# Patient Record
Sex: Male | Born: 1985 | Race: White | Hispanic: No | Marital: Married | Smoking: Never smoker
Health system: Southern US, Community
[De-identification: ages and names within clinical notes are randomized; demographics above are authoritative.]

---

## 2016-12-01 ENCOUNTER — Encounter (HOSPITAL_COMMUNITY): Payer: Self-pay | Admitting: Emergency Medicine

## 2016-12-01 ENCOUNTER — Emergency Department (HOSPITAL_COMMUNITY): Payer: Self-pay

## 2016-12-01 ENCOUNTER — Emergency Department (HOSPITAL_COMMUNITY)
Admission: EM | Admit: 2016-12-01 | Discharge: 2016-12-01 | Disposition: A | Discharge status: Home / Self Care | Payer: Self-pay | Attending: Emergency Medicine | Admitting: Emergency Medicine

## 2016-12-01 DIAGNOSIS — T189XXA Foreign body of alimentary tract, part unspecified, initial encounter: Secondary | ICD-10-CM | POA: Insufficient documentation

## 2016-12-01 DIAGNOSIS — Y999 Unspecified external cause status: Secondary | ICD-10-CM | POA: Insufficient documentation

## 2016-12-01 DIAGNOSIS — Y929 Unspecified place or not applicable: Secondary | ICD-10-CM | POA: Insufficient documentation

## 2016-12-01 DIAGNOSIS — Y9389 Activity, other specified: Secondary | ICD-10-CM | POA: Insufficient documentation

## 2016-12-01 DIAGNOSIS — X58XXXA Exposure to other specified factors, initial encounter: Secondary | ICD-10-CM | POA: Insufficient documentation

## 2016-12-01 NOTE — ED Notes (Signed)
Pt reports 2 days ago he was eating a chicken sandwich and he believes he swallowed about a 1/2 inch of the toothpick. Pt states he did not notice the toothpick while he was chewing but that when he proceeded to swallow he could "feel it scratch his throat" and that it now feels "tight, like I have a pill stuck in my throat." Pt denies difficulty swallowing, eating/drinking. Pt reports he has experienced mild coughing but denies any hemoptysis or swelling of his throat. Denies drooling. Pt speaking in full complete sentences with clear voice.

## 2016-12-01 NOTE — ED Notes (Signed)
ED Provider at bedside. 

## 2016-12-01 NOTE — Discharge Instructions (Signed)
Please follow-up with Dr. Matthias Hughs tomorrow at 12 PM at his office.  Please know food or drinks after 830, your okay to have a very light breakfast prior to that.  If you proceed with an endoscopy he will need family or close friend to be present with you for this.  If you develop any new or worsening signs or symptoms overnight please return immediately to [facility] emergency room for repeat analysis and further evaluation.

## 2016-12-01 NOTE — ED Provider Notes (Signed)
[facility] Provider Note   CSN: [id number] Arrival date & time: 12/01/16  1707     History   Chief Complaint Chief Complaint  Patient presents with  . Swallowed Foreign Body    HPI Jordan Fuller is a 31 y.o. male.  HPI   31 year old male presents today with foreign body sensation.  Patient notes that 2 days ago he was eating chicken that had a toothpick in it.  He notes the toothpick was cut at approximately 2-3 cm.  He noticed severe pain with swallowing and felt as if the toothpick got lodged in his esophagus.  Patient notes since that time he has had discomfort in the area, this is not worsened.  He reports that he is tolerating secretions, able to eat and drink without significant difficulty.  Patient reports the pain is worse at night when laying back.  He denies any fever, chest pain, or any infectious terms.  Patient denies any bleeding or dark stools.    History reviewed. No pertinent past medical history.  There are no active problems to display for this patient.   History reviewed. No pertinent surgical history.     Home Medications    Prior to Admission medications   Medication Sig Start Date End Date Taking? Authorizing Provider  acetaminophen (TYLENOL) 325 MG tablet Take 650 mg by mouth every 6 (six) hours as needed for mild pain or headache.   Yes Historical Provider, MD  carboxymethylcellulose (REFRESH PLUS) 0.5 % SOLN Place 1 drop into both eyes 3 (three) times daily as needed (dryness).   Yes Historical Provider, MD    Family History No family history on file.  Social History Social History  Substance Use Topics  . Smoking status: Never Smoker  . Smokeless tobacco: Never Used  . Alcohol use No     Allergies   Penicillins   Review of Systems Review of Systems  All other systems reviewed and are negative.    Physical Exam Updated Vital Signs BP 119/86 (BP Location: Right Arm)   Pulse 84   Temp 98.9 F (37.2 C)   Resp 18    Ht 5\' 3"  (1.6 m)   Wt 106.6 kg   SpO2 97%   BMI 41.63 kg/m   Physical Exam  Constitutional: He is oriented to person, place, and time. He appears well-developed and well-nourished.  HENT:  Head: Normocephalic and atraumatic.  Eyes: Conjunctivae are normal. Pupils are equal, round, and reactive to light. Right eye exhibits no discharge. Left eye exhibits no discharge. No scleral icterus.  Neck: Normal range of motion. Neck supple. No JVD present. No tracheal deviation present.  Pulmonary/Chest: Effort normal. No stridor.  Neurological: He is alert and oriented to person, place, and time. Coordination normal.  Psychiatric: He has a normal mood and affect. His behavior is normal. Judgment and thought content normal.  Nursing note and vitals reviewed.    ED Treatments / Results  Labs (all labs ordered are listed, but only abnormal results are displayed) Labs Reviewed - No data to display  EKG  EKG Interpretation None       Radiology Dg Neck Soft Tissue  Result Date: 12/01/2016 CLINICAL DATA:  Pt said he believes he has swallowed a piece of a toothpick. Throat feels like something is stuck. EXAM: NECK SOFT TISSUES - 1+ VIEW COMPARISON:  None. FINDINGS: There is no evidence of retropharyngeal soft tissue swelling or epiglottic enlargement. The cervical airway is unremarkable and no radio-opaque foreign body identified.  IMPRESSION: 1. No radiodense foreign body identified. Of note, wood has variable radiodensity. 2. Normal epiglottis and airway. Electronically Signed   By: Genevive Bi M.D.   On: 12/01/2016 18:49    Procedures Procedures (including critical care time)  Medications Ordered in ED Medications - No data to display   Initial Impression / Assessment and Plan / ED Course  I have reviewed the triage vital signs and the nursing notes.  Pertinent labs & imaging results that were available during my care of the patient were reviewed by me and considered in my  medical decision making (see chart for details).    Labs:   Imaging: DG soft tissue neck  Consults:  Therapeutics:  Discharge Meds:   Assessment/Plan: 31 year old male presents today with foreign body sensation.  She swallowed a toothpick.  He continues to have the sensation.  He is very well-appearing is nontoxic with no chest pain or respiratory difficulty.  He has no difficulty tolerating secretions and has not been able to eat and drink.  Plain films show no significant findings.  I spoke with Dr. Matthias Hughs who would like to see the patient tomorrow in his office at 76.  Patient was given strict follow-up information, encouraged not to eat or drink after 830, follow-up as directed.  Patient is given strict return precautions in the event new or worsening signs or symptoms present.  He verbalized understanding and agreement to today's plan had no further questions or concerns.  I have very low suspicion for esophageal rupture in this patient, low suspicion for infectious etiology.      Final Clinical Impressions(s) / ED Diagnoses   Final diagnoses:  Swallowed foreign body, initial encounter    New Prescriptions Discharge Medication List as of 12/01/2016  8:45 PM       Eyvonne Mechanic, PA-C 12/01/16 2057    Tilden Fossa, MD 12/02/16 [phone]

## 2016-12-01 NOTE — ED Triage Notes (Signed)
Pt reports possibly a toothpick is stuck in his throat ongoing for 1 1/2 days. Pt reports every time he swallows feels like something is scraping his throat. Pt able to talk in complete sentences without difficulty, no drooling noted.

## 2018-01-14 IMAGING — DX DG NECK SOFT TISSUE
2 series · 2 of 2 positions shown · non-contrast
Comparison: None.

CLINICAL DATA: Pt said he believes he has swallowed a piece of a
toothpick. Throat feels like something is stuck.

EXAM:
NECK SOFT TISSUES - 1+ VIEW

[neck lat]
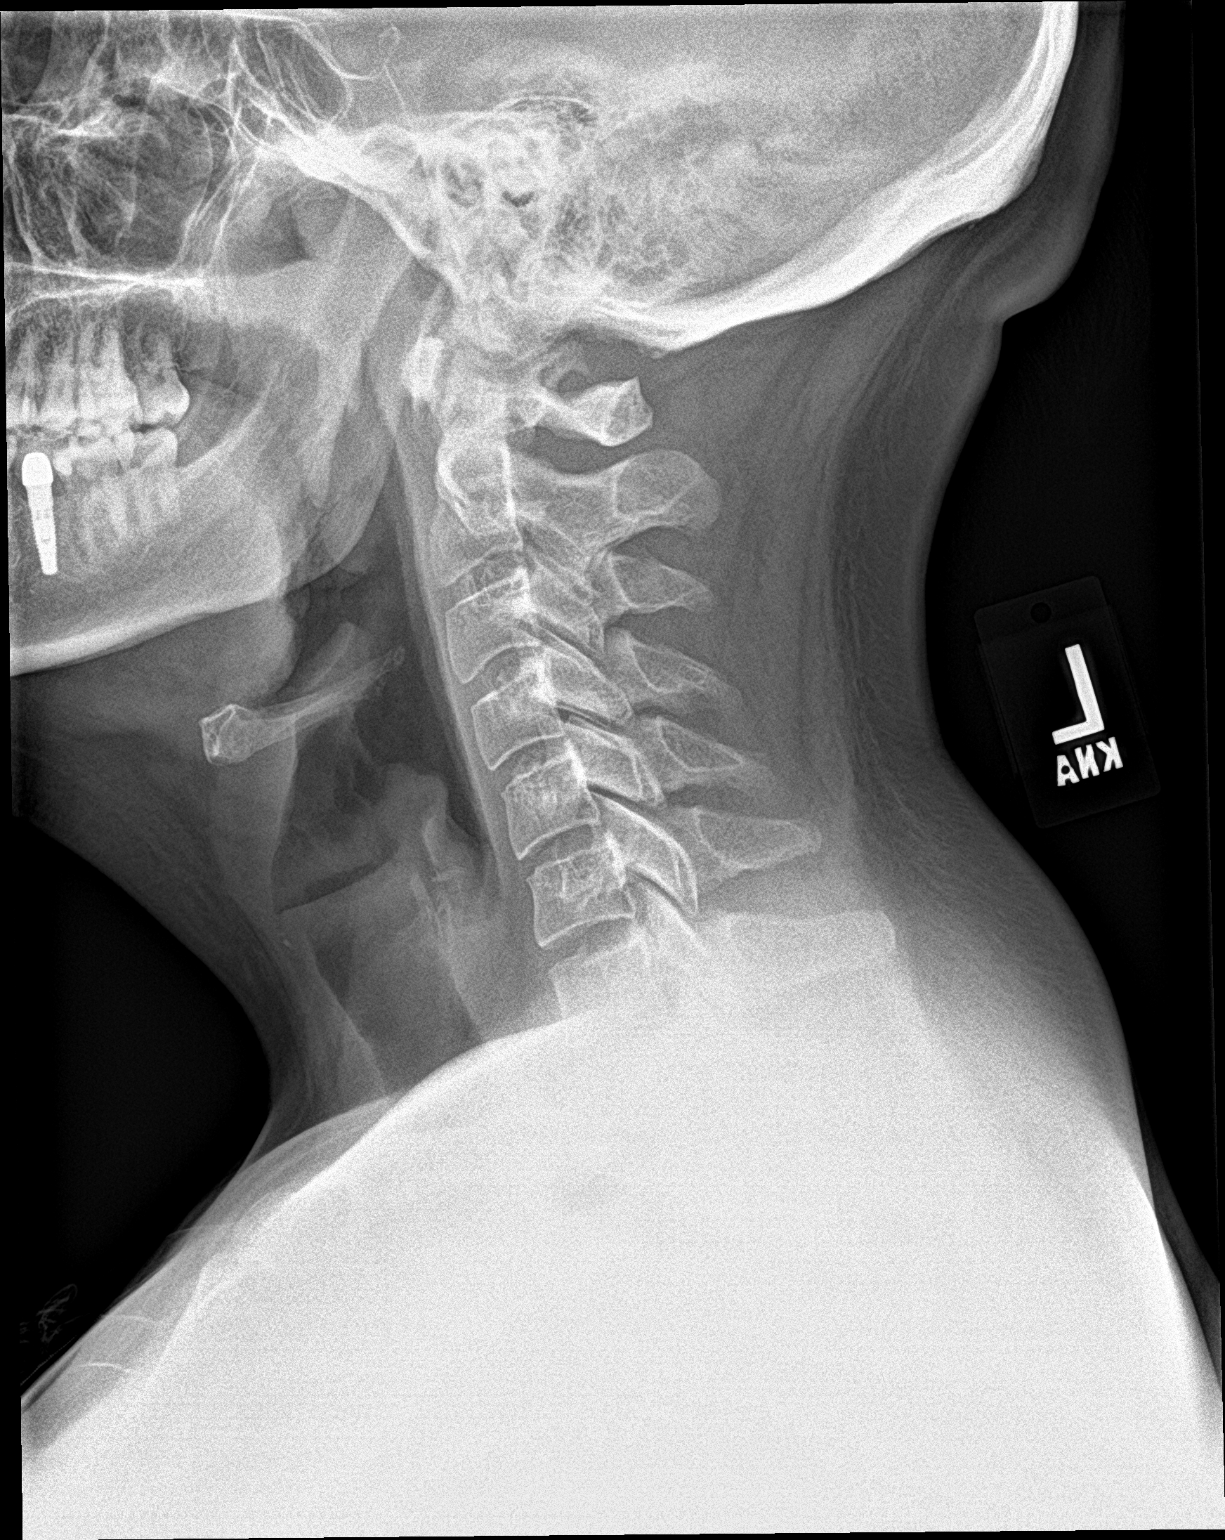

[neck ap]
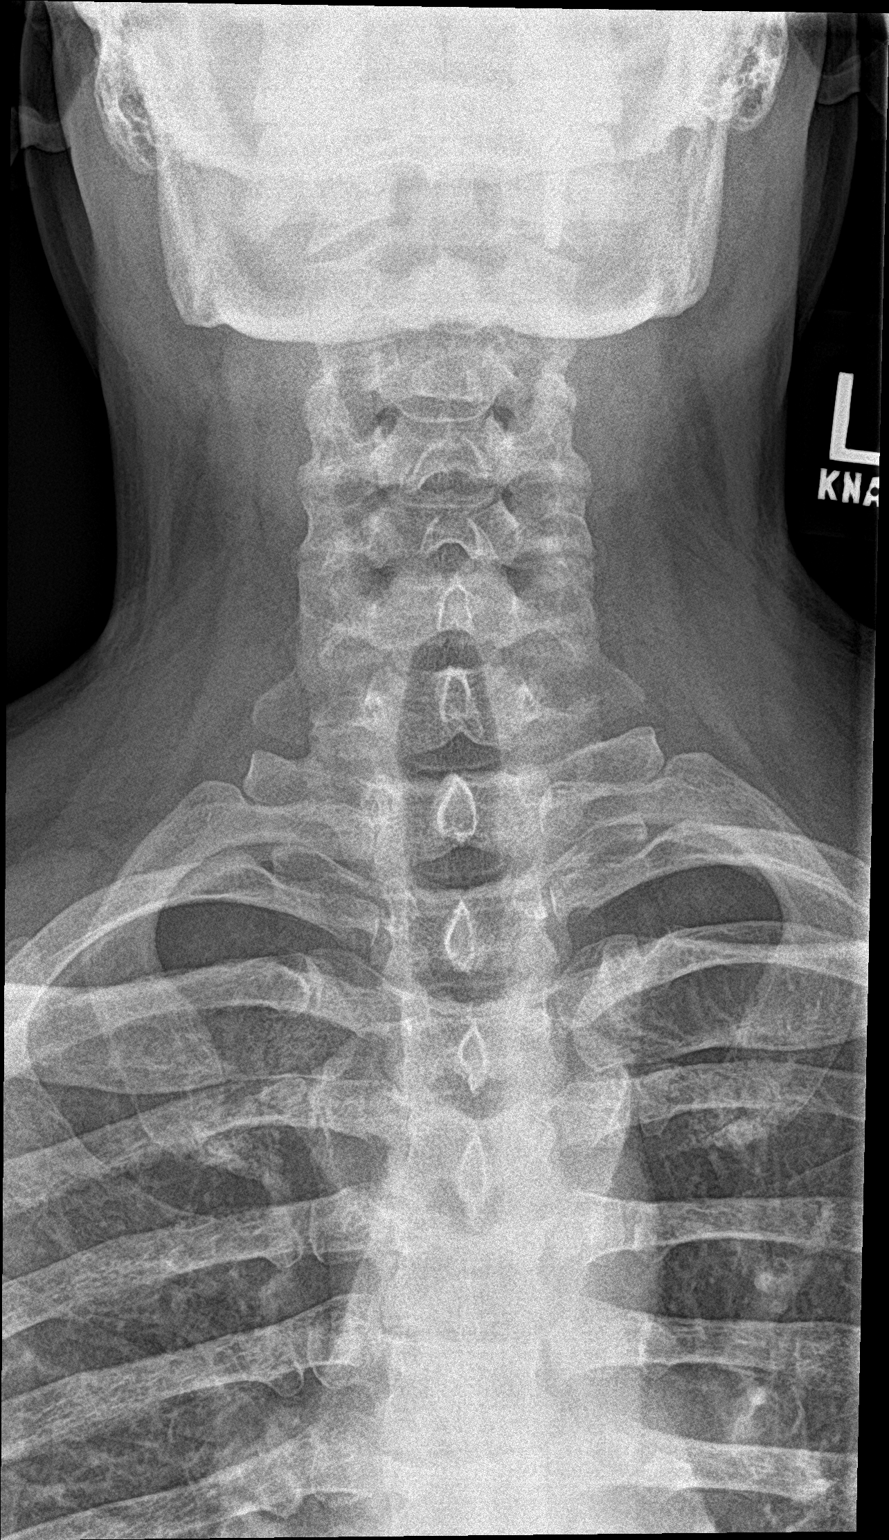

[2 of 2 positions shown; findings below may reference images not displayed]

FINDINGS: There is no evidence of retropharyngeal soft tissue swelling or
epiglottic enlargement. The cervical airway is unremarkable and no
radio-opaque foreign body identified.
IMPRESSION: 1. No radiodense foreign body identified. Of note, Alyse has variable
radiodensity.
2. Normal epiglottis and airway.

## 2021-05-10 ENCOUNTER — Ambulatory Visit
Admission: EM | Admit: 2021-05-10 | Discharge: 2021-05-10 | Disposition: A | Discharge status: Home / Self Care | Payer: Self-pay | Attending: Emergency Medicine | Admitting: Emergency Medicine

## 2021-05-10 ENCOUNTER — Encounter: Payer: Self-pay | Admitting: Emergency Medicine

## 2021-05-10 ENCOUNTER — Other Ambulatory Visit: Payer: Self-pay

## 2021-05-10 DIAGNOSIS — G44209 Tension-type headache, unspecified, not intractable: Secondary | ICD-10-CM

## 2021-05-10 MED ORDER — NAPROXEN 500 MG PO TABS: 500.0000 mg | ORAL_TABLET | Freq: Two times a day (BID) | ORAL | 0 refills | Status: AC

## 2021-05-10 NOTE — Discharge Instructions (Addendum)
PleasePlease use Naprosyn as needed for headaches  Follow-up if headaches not improving or worsening despite use of new eyewear

## 2021-05-10 NOTE — ED Triage Notes (Signed)
Patient presents to [facility] for evaluation of headaches he is having at work, starting within the two hours, and states its because of the lights at work.  States he needs a note to explain that he needs shaded eyewear.

## 2021-05-11 NOTE — ED Provider Notes (Signed)
[facility]    CSN: [id number] Arrival date & time: 05/10/21  1331      History   Chief Complaint Chief Complaint  Patient presents with   Headache    HPI Jordan Fuller is a 35 y.o. male presenting today for evaluation of headache and eye irritation.  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  Since he has been developing daily headaches with going to work, denies headaches on days he does not work.  Denies history of prior headaches.  Not relieved with Tylenol and ibuprofen.  Denies any nausea or vomiting.  HPI  History reviewed. No pertinent past medical history.  There are no problems to display for this patient.   History reviewed. No pertinent surgical history.     Home Medications    Prior to Admission medications   Medication Sig Start Date End Date Taking? Authorizing Provider  naproxen (NAPROSYN) 500 MG tablet Take 1 tablet (500 mg total) by mouth 2 (two) times daily. 05/10/21  Yes Marlis Oldaker C, PA-C  acetaminophen (TYLENOL) 325 MG tablet Take 650 mg by mouth every 6 (six) hours as needed for mild pain or headache.    [provider]  carboxymethylcellulose (REFRESH PLUS) 0.5 % SOLN Place 1 drop into both eyes 3 (three) times daily as needed (dryness).    [provider]    Family History Family History  Problem Relation Age of Onset   Healthy Mother    Healthy Father     Social History Social History   Tobacco Use   Smoking status: Never   Smokeless tobacco: Never  Substance Use Topics   Alcohol use: No   Drug use: Yes    Types: Marijuana     Allergies   Penicillins   Review of Systems Review of Systems  Constitutional:  Negative for fatigue and fever.  HENT:  Negative for congestion, sinus pressure and sore throat.   Eyes:  Negative for photophobia, pain and visual disturbance.  Respiratory:  Negative for cough and shortness of breath.    Cardiovascular:  Negative for chest pain.  Gastrointestinal:  Negative for abdominal pain, nausea and vomiting.  Genitourinary:  Negative for decreased urine volume and hematuria.  Musculoskeletal:  Negative for myalgias, neck pain and neck stiffness.  Neurological:  Positive for headaches. Negative for dizziness, syncope, facial asymmetry, speech difficulty, weakness, light-headedness and numbness.    Physical Exam Triage Vital Signs ED Triage Vitals  Enc Vitals Group     BP 05/10/21 1347 122/80     Pulse Rate 05/10/21 1347 77     Resp 05/10/21 1347 18     Temp 05/10/21 1347 98.1 F (36.7 C)     Temp Source 05/10/21 1347 Oral     SpO2 05/10/21 1347 96 %     Weight --      Height --      Head Circumference --      Peak Flow --      Pain Score 05/10/21 1349 5     Pain Loc --      Pain Edu? --      Excl. in GC? --    No data found.  Updated Vital Signs BP 122/80 (BP Location: Right Arm)   Pulse 77   Temp 98.1 F (36.7 C) (Oral)   Resp 18   SpO2 96%   Visual Acuity Right Eye Distance:   Left  Eye Distance:   Bilateral Distance:    Right Eye Near:   Left Eye Near:    Bilateral Near:     Physical Exam Vitals and nursing note reviewed.  Constitutional:      Appearance: He is well-developed.     Comments: No acute distress  HENT:     Head: Normocephalic and atraumatic.     Nose: Nose normal.  Eyes:     Extraocular Movements: Extraocular movements intact.     Conjunctiva/sclera: Conjunctivae normal.     Pupils: Pupils are equal, round, and reactive to light.  Cardiovascular:     Rate and Rhythm: Normal rate.  Pulmonary:     Effort: Pulmonary effort is normal. No respiratory distress.  Abdominal:     General: There is no distension.  Musculoskeletal:        General: Normal range of motion.     Cervical back: Neck supple.  Skin:    General: Skin is warm and dry.  Neurological:     General: No focal deficit present.     Mental Status: He is alert and  oriented to person, place, and time. Mental status is at baseline.     Cranial Nerves: No cranial nerve deficit.     UC Treatments / Results  Labs (all labs ordered are listed, but only abnormal results are displayed) Labs Reviewed - No data to display  EKG   Radiology No results found.  Procedures Procedures (including critical care time)  Medications Ordered in UC Medications - No data to display  Initial Impression / Assessment and Plan / UC Course  I have reviewed the triage vital signs and the nursing notes.  Pertinent labs & imaging results that were available during my care of the patient were reviewed by me and considered in my medical decision making (see chart for details).     New pattern of headaches since starting new job environment, do recommend patient wears initiated eyewear at his work can provide.  Feel new symptoms are likely correlated to environment/lighting in job.  Advised to follow-up if not improving despite use of eye protection.  Discussed strict return precautions. Patient verbalized understanding and is agreeable with plan.  Final Clinical Impressions(s) / UC Diagnoses   Final diagnoses:  Acute non intractable tension-type headache     Discharge Instructions      PleasePlease use Naprosyn as needed for headaches  Follow-up if headaches not improving or worsening despite use of new eyewear   ED Prescriptions     Medication Sig Dispense Auth. Provider   naproxen (NAPROSYN) 500 MG tablet Take 1 tablet (500 mg total) by mouth 2 (two) times daily. 30 tablet Stefon Ramthun, [place] C, PA-C      PDMP not reviewed this encounter.   Sharyon Cable [place] C, [place] 05/11/21 [phone]

## 2021-09-21 ENCOUNTER — Other Ambulatory Visit: Payer: Self-pay

## 2021-09-21 ENCOUNTER — Emergency Department
Admission: RE | Admit: 2021-09-21 | Discharge: 2021-09-21 | Disposition: A | Discharge status: Home / Self Care | Payer: 59 | Source: Ambulatory Visit | Attending: Family Medicine | Admitting: Family Medicine

## 2021-09-21 VITALS — BP 111/78 | HR 96 | Temp 99.0°F | Resp 16 | Ht 66.0 in | Wt 250.0 lb

## 2021-09-21 DIAGNOSIS — J069 Acute upper respiratory infection, unspecified: Secondary | ICD-10-CM

## 2021-09-21 DIAGNOSIS — J9801 Acute bronchospasm: Secondary | ICD-10-CM | POA: Diagnosis not present

## 2021-09-21 LAB — POCT INFLUENZA A/B
Influenza A, POC: NEGATIVE
Influenza B, POC: NEGATIVE

## 2021-09-21 MED ORDER — PREDNISONE 20 MG PO TABS: ORAL_TABLET | ORAL | 0 refills | Status: AC

## 2021-09-21 MED ORDER — METHYLPREDNISOLONE SODIUM SUCC 125 MG IJ SOLR
80.0000 mg | Freq: Once | INTRAMUSCULAR | Status: AC
  Administered 2021-09-21: 80 mg via INTRAMUSCULAR

## 2021-09-21 MED ORDER — ALBUTEROL SULFATE HFA 108 (90 BASE) MCG/ACT IN AERS
2.0000 | INHALATION_SPRAY | Freq: Four times a day (QID) | RESPIRATORY_TRACT | Status: DC | PRN
  Administered 2021-09-21: 2 via RESPIRATORY_TRACT

## 2021-09-21 MED ORDER — DOXYCYCLINE HYCLATE 100 MG PO CAPS: ORAL_CAPSULE | ORAL | 0 refills | Status: AC

## 2021-09-21 NOTE — ED Provider Notes (Signed)
[facility]    CSN: [id number] Arrival date & time: 09/21/21  1405      History   Chief Complaint Chief Complaint  Patient presents with   Cough    HPI Jordan Fuller is a 36 y.o. male.   About 6 days ago patient began sneezing frequently with an increase in clear nasal drainage.  This was followed by cough, fatigue, myalgias, and tightness in his chest.  He denies pleuritic pain and fevers, chills, and sweats.  He has a history of reactive airways disease that only manifests during viral URI's, having had multiple episodes of pneumonia in the past.  Patient's wife and daughter have tested negative for COVID.  The history is provided by the patient.   History reviewed. No pertinent past medical history.  There are no problems to display for this patient.   History reviewed. No pertinent surgical history.     Home Medications    Prior to Admission medications   Medication Sig Start Date End Date Taking? Authorizing Provider  doxycycline (VIBRAMYCIN) 100 MG capsule Take one cap PO Q12hr with food. 09/21/21  Yes Lattie Haw, MD  predniSONE (DELTASONE) 20 MG tablet Take one tab by mouth twice daily for 4 days, then one daily for 3 days. Take with food. 09/21/21  Yes Lattie Haw, MD  acetaminophen (TYLENOL) 325 MG tablet Take 650 mg by mouth every 6 (six) hours as needed for mild pain or headache. Patient not taking: Reported on 09/21/2021    [provider]  carboxymethylcellulose (REFRESH PLUS) 0.5 % SOLN Place 1 drop into both eyes 3 (three) times daily as needed (dryness). Patient not taking: Reported on 09/21/2021    [provider]  naproxen (NAPROSYN) 500 MG tablet Take 1 tablet (500 mg total) by mouth 2 (two) times daily. Patient not taking: Reported on 09/21/2021 05/10/21   Lew Dawes, PA-C    Family History Family History  Problem Relation Age of Onset   Healthy Mother    Healthy Father     Social History Social History    Tobacco Use   Smoking status: Never    Passive exposure: Never   Smokeless tobacco: Never  Vaping Use   Vaping Use: Never used  Substance Use Topics   Alcohol use: No   Drug use: Yes    Types: Marijuana     Allergies   Penicillins   Review of Systems Review of Systems No sore throat + cough + sneezing No pleuritic pain, but feels tight in anterior chest ? wheezing + nasal congestion + post-nasal drainage No sinus pain/pressure No itchy/red eyes No earache No hemoptysis No SOB No fever/chills No nausea No vomiting No abdominal pain No diarrhea No urinary symptoms No skin rash + fatigue + myalgias No headache Used OTC meds (Dayquil and Nyquil) without relief   Physical Exam Triage Vital Signs ED Triage Vitals  Enc Vitals Group     BP 09/21/21 1531 111/78     Pulse Rate 09/21/21 1531 96     Resp 09/21/21 1531 16     Temp 09/21/21 1531 99 F (37.2 C)     Temp Source 09/21/21 1531 Oral     SpO2 09/21/21 1531 99 %     Weight 09/21/21 1533 250 lb (113.4 kg)     Height 09/21/21 1533 5\' 6"  (1.676 m)     Head Circumference --      Peak Flow --      Pain  Score 09/21/21 1532 4     Pain Loc --      Pain Edu? --      Excl. in GC? --    No data found.  Updated Vital Signs BP 111/78 (BP Location: Left Arm)    Pulse 96    Temp 99 F (37.2 C) (Oral)    Resp 16    Ht 5\' 6"  (1.676 m)    Wt 113.4 kg    SpO2 99%    BMI 40.35 kg/m   Visual Acuity Right Eye Distance:   Left Eye Distance:   Bilateral Distance:    Right Eye Near:   Left Eye Near:    Bilateral Near:     Physical Exam Nursing notes and Vital Signs reviewed. Appearance:  Patient appears stated age, and in no acute distress Eyes:  Pupils are equal, round, and reactive to light and accomodation.  Extraocular movement is intact.  Conjunctivae are not inflamed  Ears:  Canals normal.  Tympanic membranes normal.  Nose:  Mildly congested turbinates.  No sinus tenderness.   Pharynx:  Normal Neck:   Supple.  Mildly enlarged lateral nodes are present, tender to palpation on the left.   Lungs:   Diffuse bilateral wheezes and rhonchi. Breath sounds are equal.  Moving air poorly. Heart:  Regular rate and rhythm without murmurs, rubs, or gallops.  Abdomen:  Nontender without masses or hepatosplenomegaly.  Bowel sounds are present.  No CVA or flank tenderness.  Extremities:  No edema.  Skin:  No rash present.   UC Treatments / Results  Labs (all labs ordered are listed, but only abnormal results are displayed) Labs Reviewed  POCT INFLUENZA A/B negative    EKG   Radiology No results found.  Procedures Procedures (including critical care time)  Medications Ordered in UC Medications  methylPREDNISolone sodium succinate (SOLU-MEDROL) 125 mg/2 mL injection 80 mg (80 mg Intramuscular Given 09/21/21 1640)    Initial Impression / Assessment and Plan / UC Course  I have reviewed the triage vital signs and the nursing notes.  Pertinent labs & imaging results that were available during my care of the patient were reviewed by me and considered in my medical decision making (see chart for details).    History or reactive airways disease exacerbated by viral URI's. Administered Depo Medrol 80mg  IM, then begin prednisone burst/taper. Because of past history of multiple episodes of pneumonia during viral illnesses, will begin doxycycline. Followup with Family Doctor if not improved in one week.   Final Clinical Impressions(s) / UC Diagnoses   Final diagnoses:  Viral URI with cough  Acute bronchospasm     Discharge Instructions      Begin prednisone Saturday 09/22/2021. Take plain guaifenesin (1200mg  extended release tabs such as Mucinex) twice daily, with plenty of water, for cough and congestion.  May add Pseudoephedrine (30mg , one or two every 4 to 6 hours) for sinus congestion.  Get adequate rest.   May use Afrin nasal spray (or generic oxymetazoline) each morning for about 5 days  and then discontinue.  Also recommend using saline nasal spray several times daily and saline nasal irrigation (AYR is a common brand).  Use Flonase nasal spray each morning after using Afrin nasal spray and saline nasal irrigation. Try warm salt water gargles for sore throat.  Stop all antihistamines (Nyquil, etc) for now, and other non-prescription cough/cold preparations. Continue albuterol inhaler as needed. May take Delsym Cough Suppressant ("12 Hour Cough Relief") at bedtime for nighttime  cough.   If symptoms become significantly worse during the night or over the weekend, proceed to the local emergency room.     ED Prescriptions     Medication Sig Dispense Auth. Provider   predniSONE (DELTASONE) 20 MG tablet Take one tab by mouth twice daily for 4 days, then one daily for 3 days. Take with food. 11 tablet Lattie HawBeese, Vineta Carone A, MD   doxycycline (VIBRAMYCIN) 100 MG capsule Take one cap PO Q12hr with food. 14 capsule Lattie HawBeese, Johncharles Fusselman A, MD         Lattie HawBeese, Markeda Narvaez A, MD 09/22/21 1450

## 2021-09-21 NOTE — Discharge Instructions (Addendum)
Begin prednisone Saturday 09/22/2021. Take plain guaifenesin (1200mg  extended release tabs such as Mucinex) twice daily, with plenty of water, for cough and congestion.  May add Pseudoephedrine (30mg , one or two every 4 to 6 hours) for sinus congestion.  Get adequate rest.   May use Afrin nasal spray (or generic oxymetazoline) each morning for about 5 days and then discontinue.  Also recommend using saline nasal spray several times daily and saline nasal irrigation (AYR is a common brand).  Use Flonase nasal spray each morning after using Afrin nasal spray and saline nasal irrigation. Try warm salt water gargles for sore throat.  Stop all antihistamines (Nyquil, etc) for now, and other non-prescription cough/cold preparations. Continue albuterol inhaler as needed. May take Delsym Cough Suppressant ("12 Hour Cough Relief") at bedtime for nighttime cough.   If symptoms become significantly worse during the night or over the weekend, proceed to the local emergency room.

## 2021-09-21 NOTE — ED Notes (Signed)
Pt states albuterol inhaler he has been using is from 2014 - Dr Cathren Harsh updated - see order for albuterol inhaler for home use per Dr Cathren Harsh

## 2021-09-21 NOTE — ED Triage Notes (Addendum)
Cough x 3 days  Pt's wife & daughter are negative for COVID  OTC - dayquil & nyquil  Pt is prone to walking pneumonia  Pt does not have a PCP
